# Patient Record
Sex: Male | Born: 1985 | Race: White | Hispanic: No | Marital: Single | Smoking: Current every day smoker
Health system: Southern US, Community
[De-identification: ages and names within clinical notes are randomized; demographics above are authoritative.]

## PROBLEM LIST (undated history)

## (undated) DIAGNOSIS — C801 Malignant (primary) neoplasm, unspecified: Secondary | ICD-10-CM

---

## 2009-12-07 ENCOUNTER — Ambulatory Visit: Payer: Self-pay | Admitting: Family Medicine

## 2009-12-07 DIAGNOSIS — M549 Dorsalgia, unspecified: Secondary | ICD-10-CM | POA: Insufficient documentation

## 2009-12-14 ENCOUNTER — Ambulatory Visit: Payer: Self-pay | Admitting: Family Medicine
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
## 2010-06-20 NOTE — Letter (Signed)
Summary: Out of Work  Adult nurse at [employer]  [address]   [place], [place] [address]   Phone: [phone]  Fax: [phone]    December 14, 2009   Employee:  Epifanio Lesches    To Whom It May Concern:   For Medical reasons, please excuse the above named employee from work for the following dates:  Start:    End:   may return to regular work duty 12-15-09  If you need additional information, please feel free to contact our office.         Sincerely,    Evelena Peat MD

## 2010-06-20 NOTE — Letter (Signed)
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   [place], [place] [address]   Phone: [phone]  Fax: [phone]    December 07, 2009   Employee:  Gilles Chiquito    To Whom It May Concern:   For Medical reasons, please excuse the above named employee from work for the following dates:  Start:   12-07-09  End:   12-14-09  If you need additional information, please feel free to contact our office.         Sincerely,    Evelena Peat MD

## 2013-08-04 DIAGNOSIS — R590 Localized enlarged lymph nodes: Secondary | ICD-10-CM | POA: Insufficient documentation

## 2013-10-02 DIAGNOSIS — C8330 Diffuse large B-cell lymphoma, unspecified site: Secondary | ICD-10-CM | POA: Insufficient documentation

## 2013-10-16 ENCOUNTER — Ambulatory Visit: Payer: Self-pay | Admitting: Physician Assistant

## 2013-10-28 ENCOUNTER — Ambulatory Visit: Payer: Self-pay | Admitting: Physician Assistant

## 2014-02-24 DIAGNOSIS — F17200 Nicotine dependence, unspecified, uncomplicated: Secondary | ICD-10-CM | POA: Insufficient documentation

## 2015-07-23 ENCOUNTER — Encounter (HOSPITAL_BASED_OUTPATIENT_CLINIC_OR_DEPARTMENT_OTHER): Payer: Self-pay | Admitting: *Deleted

## 2015-07-23 ENCOUNTER — Emergency Department (HOSPITAL_BASED_OUTPATIENT_CLINIC_OR_DEPARTMENT_OTHER): Payer: BLUE CROSS/BLUE SHIELD

## 2015-07-23 ENCOUNTER — Emergency Department (HOSPITAL_BASED_OUTPATIENT_CLINIC_OR_DEPARTMENT_OTHER)
Admission: EM | Admit: 2015-07-23 | Discharge: 2015-07-23 | Disposition: A | Discharge status: Home / Self Care | Payer: BLUE CROSS/BLUE SHIELD | Attending: Emergency Medicine | Admitting: Emergency Medicine

## 2015-07-23 DIAGNOSIS — F17210 Nicotine dependence, cigarettes, uncomplicated: Secondary | ICD-10-CM | POA: Diagnosis not present

## 2015-07-23 DIAGNOSIS — W1839XA Other fall on same level, initial encounter: Secondary | ICD-10-CM | POA: Insufficient documentation

## 2015-07-23 DIAGNOSIS — Y998 Other external cause status: Secondary | ICD-10-CM | POA: Insufficient documentation

## 2015-07-23 DIAGNOSIS — S43102A Unspecified dislocation of left acromioclavicular joint, initial encounter: Secondary | ICD-10-CM

## 2015-07-23 DIAGNOSIS — Y9289 Other specified places as the place of occurrence of the external cause: Secondary | ICD-10-CM | POA: Insufficient documentation

## 2015-07-23 DIAGNOSIS — Z859 Personal history of malignant neoplasm, unspecified: Secondary | ICD-10-CM | POA: Insufficient documentation

## 2015-07-23 DIAGNOSIS — S0181XA Laceration without foreign body of other part of head, initial encounter: Secondary | ICD-10-CM | POA: Diagnosis not present

## 2015-07-23 DIAGNOSIS — Y9389 Activity, other specified: Secondary | ICD-10-CM | POA: Diagnosis not present

## 2015-07-23 DIAGNOSIS — S4992XA Unspecified injury of left shoulder and upper arm, initial encounter: Secondary | ICD-10-CM | POA: Diagnosis present

## 2015-07-23 HISTORY — DX: Malignant (primary) neoplasm, unspecified: C80.1

## 2015-07-23 MED ORDER — HYDROCODONE-ACETAMINOPHEN 5-325 MG PO TABS
1.0000 | ORAL_TABLET | Freq: Once | ORAL | Status: AC
  Administered 2015-07-23: 1 via ORAL
  Filled 2015-07-23: qty 1

## 2015-07-23 MED ORDER — HYDROCODONE-ACETAMINOPHEN 5-325 MG PO TABS: 1.0000 | ORAL_TABLET | ORAL | Status: DC | PRN

## 2015-07-23 NOTE — ED Notes (Signed)
Pt reports he fell last night. Laceration over left eye. Left shoulder pain. Denies LOC

## 2015-07-23 NOTE — ED Notes (Signed)
PA-C at bedside 

## 2015-07-23 NOTE — Discharge Instructions (Signed)
Read the information below.  Use the prescribed medication as directed.  Please discuss all new medications with your pharmacist.  Do not take additional tylenol while taking the prescribed pain medication to avoid overdose.  You may return to the Emergency Department at any time for worsening condition or any new symptoms that concern you.    If you develop redness, swelling, pus draining from the wound, or fevers greater than 100.4, return to the ER immediately for a recheck.    If you develop redness, swelling, pus draining from the wound, or fevers greater than 100.4, return to the ER immediately for a recheck.     Acromioclavicular Separation With Rehab The acromioclavicular joint is the joint between the roof of the shoulder (acromion) and the collarbone (clavicle). It is vulnerable to injury. An acromioclavicular [facility]) separation is a partial or complete tear (sprain), injury, or redness and soreness (inflammation) of the ligaments that cross the acromioclavicular joint and hold it in place. There are two ligaments in this area that are vulnerable to injury, the acromioclavicular ligament and the coracoclavicular ligament. SYMPTOMS   Tenderness and swelling, or a bump on top of the shoulder (at the [facility] joint).  Bruising (contusion) in the area within 48 hours of injury.  Loss of strength or pain when reaching over the head or across the body. CAUSES  AC separation is caused by direct trauma to the joint (falling on your shoulder) or indirect trauma (falling on an outstretched arm). RISK INCREASES WITH:  Sports that require contact or collision, throwing sports (i.e. racquetball, squash).  Poor strength and flexibility.  Previous shoulder sprain or dislocation.  Poorly fitted or padded protective equipment. PREVENTION   Warm-up and stretch properly before activity.  Maintain physical fitness:  Shoulder strength.  Shoulder flexibility.  Cardiovascular fitness.  Wear properly  fitted and padded protective equipment.  Learn and use proper technique when playing sports. Have a coach correct improper technique, including falling and landing.  Apply taping, protective strapping or padding, or an adhesive bandage as recommended before practice or competition. PROGNOSIS   If treated properly, the symptoms of AC separation can be expected to go away.  If treated improperly, permanent disability may occur unless surgery is performed.  Healing time varies with type of sport and position, arm injured (dominant versus non-dominant) and severity of sprain. RELATED COMPLICATIONS  Weakness and fatigue of the arm or shoulder are possible but uncommon.  Pain and inflammation of the [facility] joint may continue.  Prolonged healing time may be necessary if usual activities are resumed too early. This causes a susceptibility to recurrent injury.  Prolonged disability may occur.  The shoulder may remain unstable or arthritic following repeated injury. TREATMENT  Treatment initially involves ice and medication to help reduce pain and inflammation. It may also be necessary to modify your activities in order to prevent further injury. Both non-surgical and surgical interventions exist to treat AC separation. Non-surgical intervention is usually recommended and involves wearing a sling to immobilize the joint for a period of time to allow for healing. Surgical intervention is usually only considered for severe sprains of the ligament or for individuals who do not improve after 2 to 6 months of non-surgical treatment. Surgical interventions require 4 to 6 months before a return to sports is possible. MEDICATION  If pain medication is necessary, nonsteroidal anti-inflammatory medications, such as aspirin and ibuprofen, or other minor pain relievers, such as acetaminophen, are often recommended.  Do not take pain medication  for 7 days before surgery.  Prescription pain relievers may be  given by your caregiver. Use only as directed and only as much as you need.  Ointments applied to the skin may be helpful.  Corticosteroid injections may be given to reduce inflammation. HEAT AND COLD  Cold treatment (icing) relieves pain and reduces inflammation. Cold treatment should be applied for 10 to 15 minutes every 2 to 3 hours for inflammation and pain and immediately after any activity that aggravates your symptoms. Use ice packs or an ice massage.  Heat treatment may be used prior to performing the stretching and strengthening activities prescribed by your caregiver, physical therapist or athletic trainer. Use a heat pack or a warm soak. SEEK IMMEDIATE MEDICAL CARE IF:   Pain, swelling or bruising worsens despite treatment.  There is pain, numbness or coldness in the arm.  Discoloration appears in the fingernails.  New, unexplained symptoms develop. acial Laceration A facial laceration is a cut on the face. These injuries can be painful and cause bleeding. Some cuts may need to be closed with stitches (sutures), skin adhesive strips, or wound glue. Cuts usually heal quickly but can leave a scar. It can take 1-2 years for the scar to go away completely. HOME CARE   Only take medicines as told by your doctor.  Follow your doctor's instructions for wound care. For Stitches:  Keep the cut clean and dry.  If you have a bandage (dressing), change it at least once a day. Change the bandage if it gets wet or dirty, or as told by your doctor.  Wash the cut with soap and water 2 times a day. Rinse the cut with water. Pat it dry with a clean towel.  Put a thin layer of medicated cream on the cut as told by your doctor.  You may shower after the first 24 hours. Do not soak the cut in water until the stitches are removed.  Have your stitches removed as told by your doctor.  Do not wear any makeup until a few days after your stitches are removed. For Skin Adhesive  Strips:  Keep the cut clean and dry.  Do not get the strips wet. You may take a bath, but be careful to keep the cut dry.  If the cut gets wet, pat it dry with a clean towel.  The strips will fall off on their own. Do not remove the strips that are still stuck to the cut. For Wound Glue:  You may shower or take baths. Do not soak or scrub the cut. Do not swim. Avoid heavy sweating until the glue falls off on its own. After a shower or bath, pat the cut dry with a clean towel.  Do not put medicine or makeup on your cut until the glue falls off.  If you have a bandage, do not put tape over the glue.  Avoid lots of sunlight or tanning lamps until the glue falls off.  The glue will fall off on its own in 5-10 days. Do not pick at the glue. After Healing:  Put sunscreen on the cut for the first year to reduce your scar. GET HELP IF:  You have a fever. GET HELP RIGHT AWAY IF:   Your cut area gets red, painful, or puffy (swollen).  You see a yellowish-white fluid (pus) coming from the cut.   This information is not intended to replace advice given to you by your health care provider. Make sure you discuss  any questions you have with your health care provider.   Document Released: 10/24/2007 Document Revised: 05/28/2014 Document Reviewed: 12/18/2012 Elsevier Interactive Patient Education [employer].
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
## 2015-07-25 ENCOUNTER — Encounter: Payer: Self-pay | Admitting: Family Medicine

## 2015-07-25 ENCOUNTER — Ambulatory Visit (INDEPENDENT_AMBULATORY_CARE_PROVIDER_SITE_OTHER): Payer: BLUE CROSS/BLUE SHIELD | Admitting: Family Medicine

## 2015-07-25 VITALS — BP 136/85 | HR 109 | Ht 74.0 in | Wt 200.0 lb

## 2015-07-25 DIAGNOSIS — S43085A Other dislocation of left shoulder joint, initial encounter: Secondary | ICD-10-CM | POA: Diagnosis not present

## 2015-07-25 MED ORDER — HYDROCODONE-ACETAMINOPHEN 5-325 MG PO TABS: 1.0000 | ORAL_TABLET | Freq: Four times a day (QID) | ORAL | Status: AC | PRN

## 2015-07-25 NOTE — Patient Instructions (Signed)
You have a Grade 3 shoulder separation. Use the sling for comfort Ice the area 3-4 times a day for 15 minutes at a time Aleve 2 tabs twice a day with food OR ibuprofen 600mg  three times a day with food for pain and inflammation. Norco as needed for severe pain (no driving on these). We will start motion exercises after i see you back in 2 weeks. When able to do these range of motion exercises, will advance to rotator cuff and scapular stabilizer strengthening/exercises. Follow up in 2 weeks.

## 2015-07-27 DIAGNOSIS — S43085A Other dislocation of left shoulder joint, initial encounter: Secondary | ICD-10-CM | POA: Insufficient documentation
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
## 2015-07-27 NOTE — Assessment & Plan Note (Signed)
Discussed conservative vs operative management - would recommend trying conservative treatment first for this.  Icing, sling, nsaids with norco as needed for severe pain.  F/u in 2 weeks for reevaluation.  Plan to add ROM exercises at that time.

## 2015-08-08 ENCOUNTER — Ambulatory Visit (INDEPENDENT_AMBULATORY_CARE_PROVIDER_SITE_OTHER): Payer: BLUE CROSS/BLUE SHIELD | Admitting: Family Medicine

## 2015-08-08 ENCOUNTER — Encounter: Payer: Self-pay | Admitting: Family Medicine

## 2015-08-08 VITALS — BP 116/75 | HR 75 | Ht 74.0 in | Wt 200.0 lb

## 2015-08-08 DIAGNOSIS — S43085D Other dislocation of left shoulder joint, subsequent encounter: Secondary | ICD-10-CM | POA: Diagnosis not present

## 2015-08-08 NOTE — Patient Instructions (Signed)
You have a Grade 3 shoulder separation. Use the sling for comfort. Ice the area 3-4 times a day for 15 minutes at a time Aleve 2 tabs twice a day with food OR ibuprofen 600mg  three times a day with food for pain and inflammation. Norco as needed for severe pain (no driving on these). Start arm circles, arm swings, table slides - 3 sets of 10 twice a day. Follow up in 4 weeks. If you're not making progress by 6 weeks I would consider orthopedic referral.

## 2015-08-09 ENCOUNTER — Encounter: Payer: Self-pay | Admitting: Family Medicine

## 2015-08-12 NOTE — Assessment & Plan Note (Signed)
Will start doing home exercise program now.  Icing, nsaids with norco as needed.  F/u in 4 weeks.  Consider physical therapy, ortho referral depending on his improvement at that visit.
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
## 2015-09-05 ENCOUNTER — Ambulatory Visit (INDEPENDENT_AMBULATORY_CARE_PROVIDER_SITE_OTHER): Payer: BLUE CROSS/BLUE SHIELD | Admitting: Family Medicine

## 2015-09-05 ENCOUNTER — Encounter: Payer: Self-pay | Admitting: Family Medicine

## 2015-09-05 VITALS — BP 116/81 | HR 70 | Ht 74.0 in | Wt 200.0 lb

## 2015-09-05 DIAGNOSIS — S43085D Other dislocation of left shoulder joint, subsequent encounter: Secondary | ICD-10-CM | POA: Diagnosis not present

## 2015-09-05 NOTE — Patient Instructions (Signed)
Start the strengthening exercises.  3 sets of 10 once a day (can start with 3 sets of 5 to test this first). Call me if you want to do physical therapy or if you're really struggling and we will talk about possible surgery. Icing as needed 15 minutes at a time 3-4 times a day. Return to light duty in 2 weeks. Follow up with me in 4-6 weeks.
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
## 2015-09-06 NOTE — Assessment & Plan Note (Signed)
He has improved over past 6 weeks but still has some pain and hasn't tried lifting anything of substance yet.  He will start strengthening exercises now.  Icing, nsaids if needed.  Return to light duty in 2 weeks.  F/u in 4-6 weeks.  Can consider ortho referral if he struggles but he is improving to date as expected.

## 2015-10-18 ENCOUNTER — Encounter: Payer: Self-pay | Admitting: Family Medicine

## 2015-10-18 ENCOUNTER — Ambulatory Visit (INDEPENDENT_AMBULATORY_CARE_PROVIDER_SITE_OTHER): Payer: BLUE CROSS/BLUE SHIELD | Admitting: Family Medicine

## 2015-10-18 VITALS — BP 119/81 | HR 73 | Ht 74.0 in | Wt 200.0 lb

## 2015-10-18 DIAGNOSIS — S43085D Other dislocation of left shoulder joint, subsequent encounter: Secondary | ICD-10-CM | POA: Diagnosis not present

## 2015-10-18 NOTE — Assessment & Plan Note (Signed)
Clinically healed at this point.  Will return to work full duty.  F/u prn.  Tylenol, nsaids only if needed.  Icing if needed.

## 2015-10-18 NOTE — Patient Instructions (Signed)
Return to full duty. Follow up with [place] as needed.
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
## 2016-09-26 IMAGING — CR DG SHOULDER 2+V*L*
3 series · 3 of 3 positions shown · non-contrast
Comparison: None.

CLINICAL DATA: Fall and shoulder last night. Left shoulder pain and
limited range of motion. Initial encounter.

EXAM:
LEFT SHOULDER - 2+ VIEW

[w shoulder grashey left]
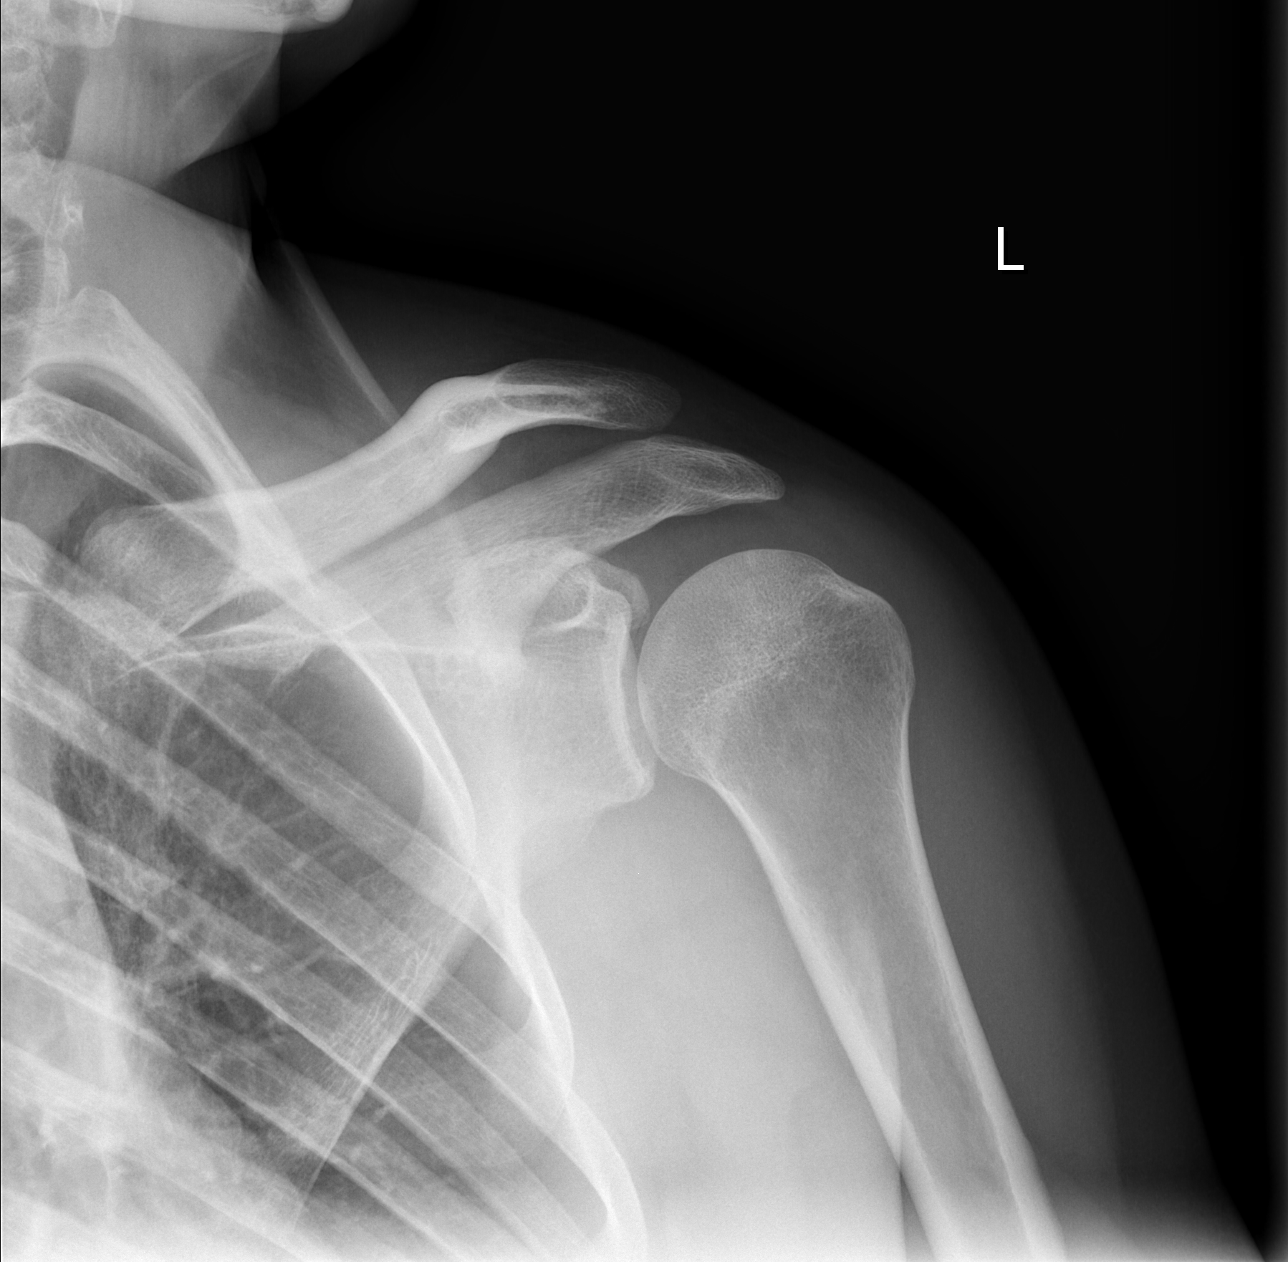

[w shoulder y view left]
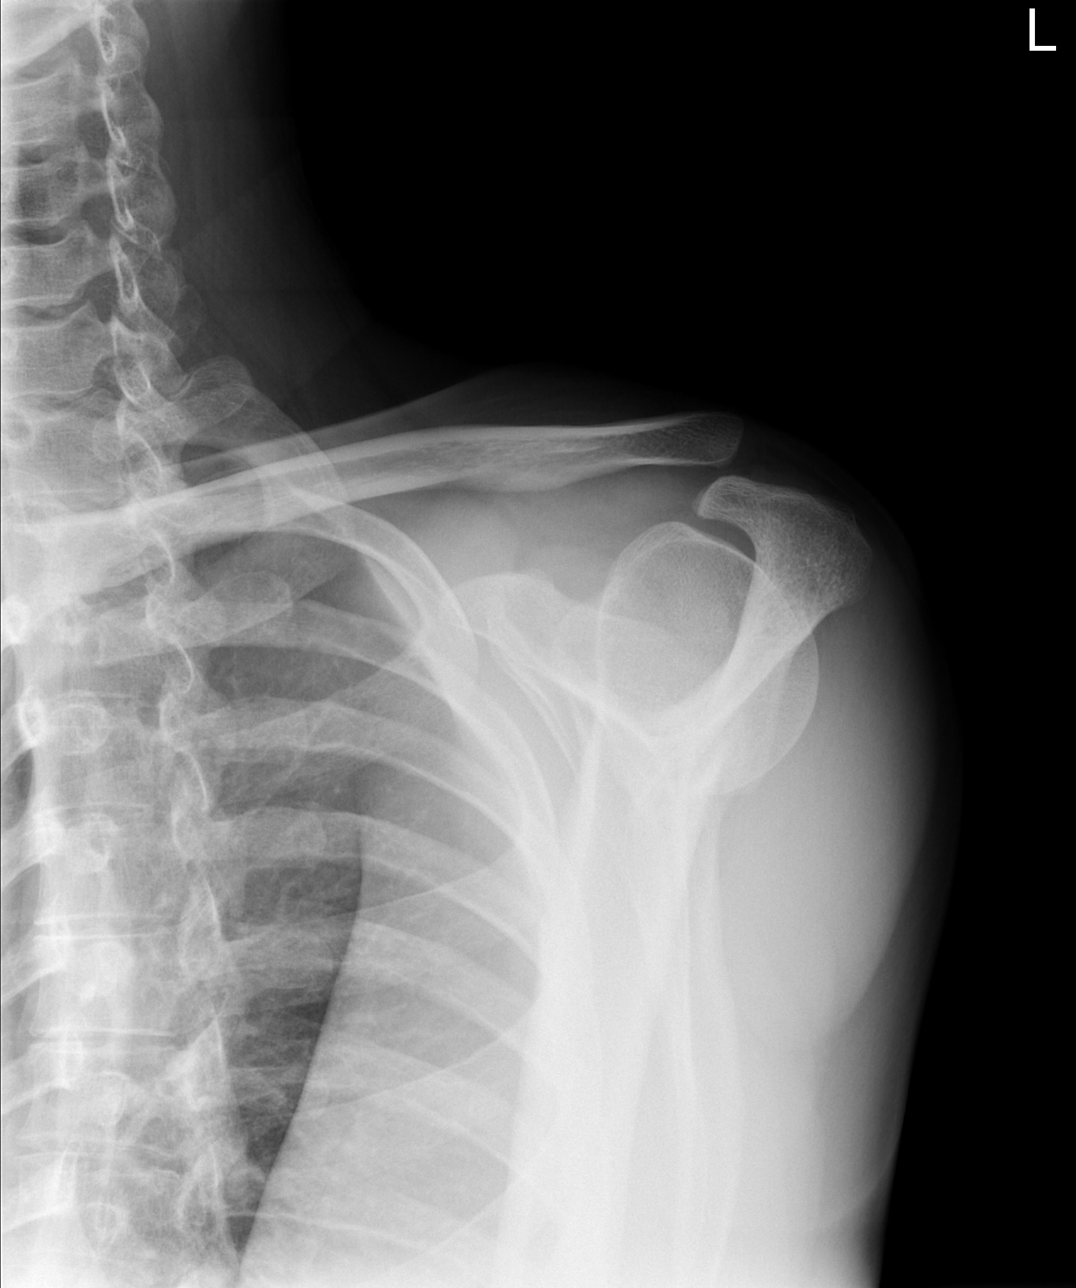

[w shoulder ap internal left]
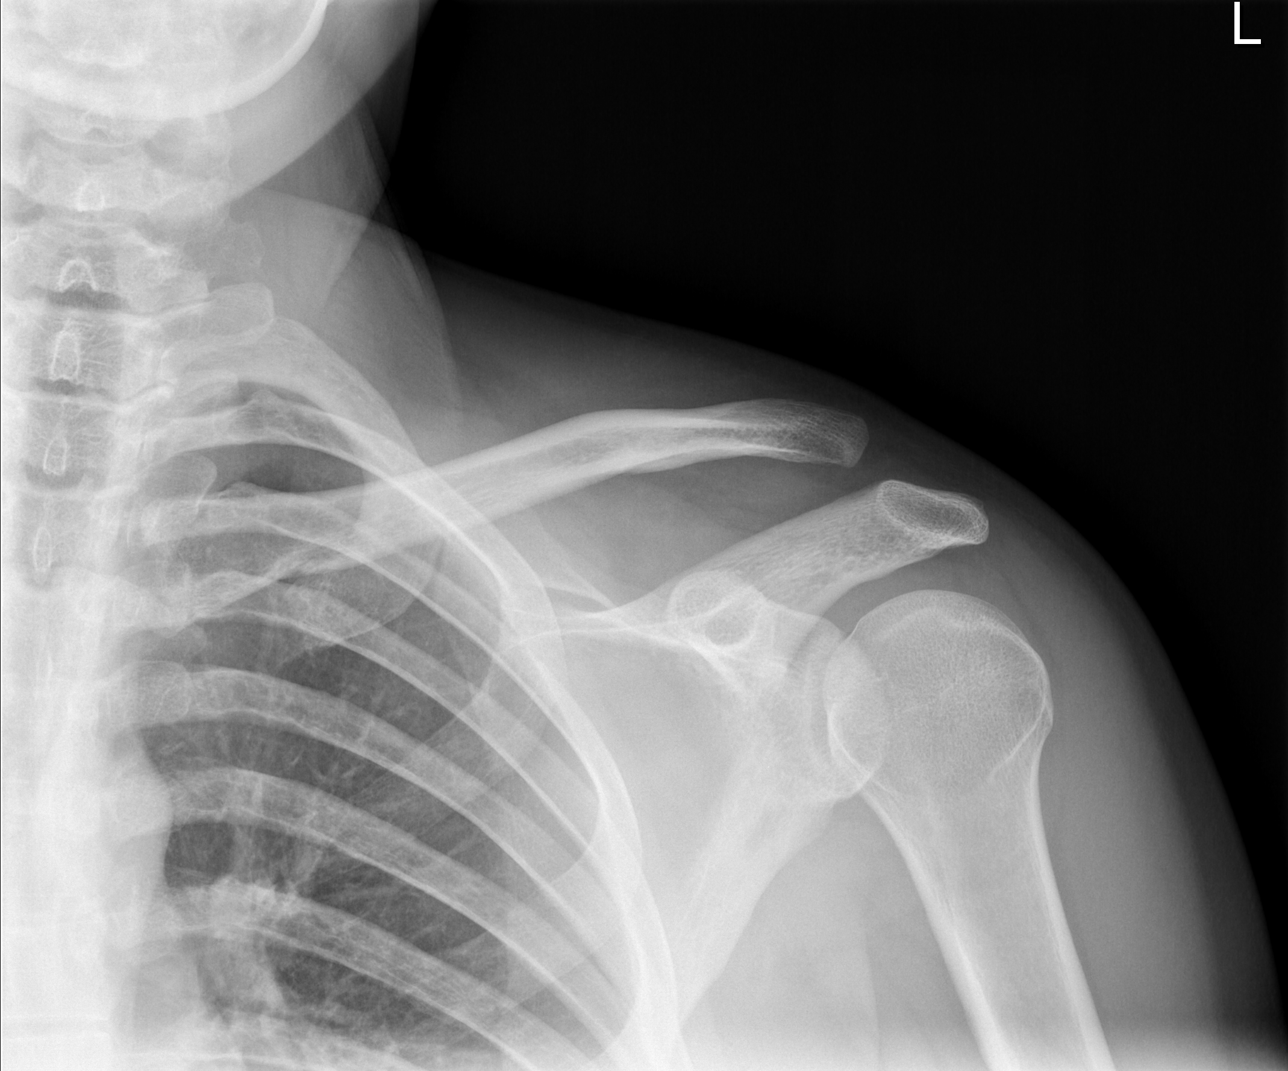

[3 of 3 positions shown; findings below may reference images not displayed]

FINDINGS: No evidence of fracture or glenohumeral dislocation.
Acromioclavicular joint widening and superior displacement of the
distal clavicle seen with respect to the acromion, consistent with
grade 3 AC joint injury.
IMPRESSION: Grade 3 AC joint injury.  No evidence of fracture.

## 2023-08-18 ENCOUNTER — Encounter (HOSPITAL_BASED_OUTPATIENT_CLINIC_OR_DEPARTMENT_OTHER): Payer: Self-pay | Admitting: Emergency Medicine

## 2023-08-18 ENCOUNTER — Emergency Department (HOSPITAL_BASED_OUTPATIENT_CLINIC_OR_DEPARTMENT_OTHER)
Admission: EM | Admit: 2023-08-18 | Discharge: 2023-08-18 | Disposition: A | Discharge status: Home / Self Care | Attending: Emergency Medicine | Admitting: Emergency Medicine

## 2023-08-18 ENCOUNTER — Emergency Department (HOSPITAL_BASED_OUTPATIENT_CLINIC_OR_DEPARTMENT_OTHER)

## 2023-08-18 ENCOUNTER — Other Ambulatory Visit: Payer: Self-pay

## 2023-08-18 DIAGNOSIS — F419 Anxiety disorder, unspecified: Secondary | ICD-10-CM | POA: Insufficient documentation

## 2023-08-18 DIAGNOSIS — M25562 Pain in left knee: Secondary | ICD-10-CM | POA: Diagnosis present

## 2023-08-18 DIAGNOSIS — X509XXA Other and unspecified overexertion or strenuous movements or postures, initial encounter: Secondary | ICD-10-CM | POA: Insufficient documentation

## 2023-08-18 MED ORDER — KETOROLAC TROMETHAMINE 30 MG/ML IJ SOLN
30.0000 mg | Freq: Once | INTRAMUSCULAR | Status: AC
  Administered 2023-08-18: 30 mg via INTRAMUSCULAR
  Filled 2023-08-18: qty 1

## 2023-08-18 MED ORDER — OXYCODONE-ACETAMINOPHEN 5-325 MG PO TABS: 1.0000 | ORAL_TABLET | Freq: Four times a day (QID) | ORAL | 0 refills | Status: AC | PRN

## 2023-08-18 MED ORDER — OXYCODONE-ACETAMINOPHEN 5-325 MG PO TABS
1.0000 | ORAL_TABLET | Freq: Once | ORAL | Status: AC
  Administered 2023-08-18: 1 via ORAL
  Filled 2023-08-18: qty 1

## 2023-08-18 NOTE — ED Provider Notes (Signed)
 [place] [facility] Provider Note   CSN: [id number] Arrival date & time: 08/18/23  1605     History  Chief Complaint  Patient presents with   Knee Pain    Keith Alvarez is a 38 y.o. male.  This is a 38 year old male is here today with left knee pain.  Patient says that he stepped wrong and felt his knee give out last night.   Knee Pain      Home Medications Prior to Admission medications   Medication Sig Start Date End Date Taking? Authorizing Provider  oxyCODONE-acetaminophen (PERCOCET/ROXICET) 5-325 MG tablet Take 1 tablet by mouth every 6 (six) hours as needed for severe pain (pain score 7-10). 08/18/23  Yes Anders Simmonds T, DO  HYDROcodone-acetaminophen (NORCO/VICODIN) 5-325 MG tablet Take 1 tablet by mouth every 6 (six) hours as needed for moderate pain or severe pain. 07/25/15   Lenda Kelp, MD      Allergies    Patient has no known allergies.    Review of Systems   Review of Systems  Physical Exam Updated Vital Signs BP (!) 119/94 (BP Location: Right Arm)   Pulse (!) 131   Temp 98.1 F (36.7 C) (Oral)   Resp 16   Ht 6\' 2"  (1.88 m)   Wt 108.9 kg   SpO2 95%   BMI 30.81 kg/m  Physical Exam Vitals reviewed.  Cardiovascular:     Rate and Rhythm: Normal rate.     Comments: Warm lower extremity.  Palpable popliteal pulse. Musculoskeletal:     Comments: Negative anterior and posterior drawer test.  Patient with positive McMurry test on the left lateral knee.  Skin:    General: Skin is warm and dry.  Neurological:     General: No focal deficit present.     Mental Status: He is alert.  Psychiatric:     Comments: Anxious.     ED Results / Procedures / Treatments   Labs (all labs ordered are listed, but only abnormal results are displayed) Labs Reviewed - No data to display  EKG None  Radiology DG Knee Complete 4 Views Left Result Date: 08/18/2023 CLINICAL DATA:  Left knee pain EXAM: LEFT KNEE -  COMPLETE 4+ VIEW COMPARISON:  None Available. FINDINGS: No evidence of fracture, dislocation, or joint effusion. No evidence of arthropathy or other focal bone abnormality. Soft tissues are unremarkable. IMPRESSION: Negative. Electronically Signed   By: Duanne Guess D.O.   On: 08/18/2023 16:52    Procedures Procedures    Medications Ordered in ED Medications  oxyCODONE-acetaminophen (PERCOCET/ROXICET) 5-325 MG per tablet 1 tablet (1 tablet Oral Given 08/18/23 1705)  ketorolac (TORADOL) 30 MG/ML injection 30 mg (30 mg Intramuscular Given 08/18/23 1706)    ED Course/ Medical Decision Making/ A&P                                 Medical Decision Making 38 year old male here today with left knee pain.  Plan-patient likely with a meniscal injury.  He has no evidence of knee dislocation and relocation.  Good pulses.  Plain films of the knee negative.  Will discharge patient with orthopedic follow-up.  Amount and/or Complexity of Data Reviewed Radiology: ordered.  Risk Prescription drug management.           Final Clinical Impression(s) / ED Diagnoses Final diagnoses:  Acute pain of left knee    Rx / DC  Orders ED Discharge Orders          Ordered    oxyCODONE-acetaminophen (PERCOCET/ROXICET) 5-325 MG tablet  Every 6 hours PRN        08/18/23 1709              Anders Simmonds T, DO 08/18/23 1710

## 2023-08-18 NOTE — ED Triage Notes (Signed)
 Pt with LT knee pain since last night after stepping wrong and turning it

## 2023-08-18 NOTE — Discharge Instructions (Addendum)
 I have sent your prescription for Percocet.  This is oxycodone and Tylenol.  You can take this as needed for severe breakthrough knee pain.  I also recommend that you take 400 mg of Motrin every 6 hours.  Included in your discharge paperwork is a telephone number for a orthopedic surgeon.  If you are unable to set up an appointment with your previously used orthopedic surgeon, you can call them and tell them that you were seen in the emergency room for any injury, with concern for meniscal injury.
